# Patient Record
Sex: Female | Born: 2000 | Race: Black or African American | Hispanic: No | Marital: Single | Smoking: Never smoker
Health system: Southern US, Community
[De-identification: ages and names within clinical notes are randomized; demographics above are authoritative.]

## PROBLEM LIST (undated history)

## (undated) DIAGNOSIS — K5900 Constipation, unspecified: Secondary | ICD-10-CM

---
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
## 2019-08-19 IMAGING — US US ABDOMEN LIMITED
1 series · 6 of 6 positions shown · non-contrast
Comparison: None.

CLINICAL DATA: Right lower quadrant pain for 1 day

EXAM:
ULTRASOUND ABDOMEN LIMITED
TECHNIQUE: Gray scale imaging of the right lower quadrant was performed to
evaluate for suspected appendicitis. Standard imaging planes and
graded compression technique were utilized.

[Series 1: us abdomen limited · 0.11mm/px · 6 of 6 slices shown]
[im 1/6]
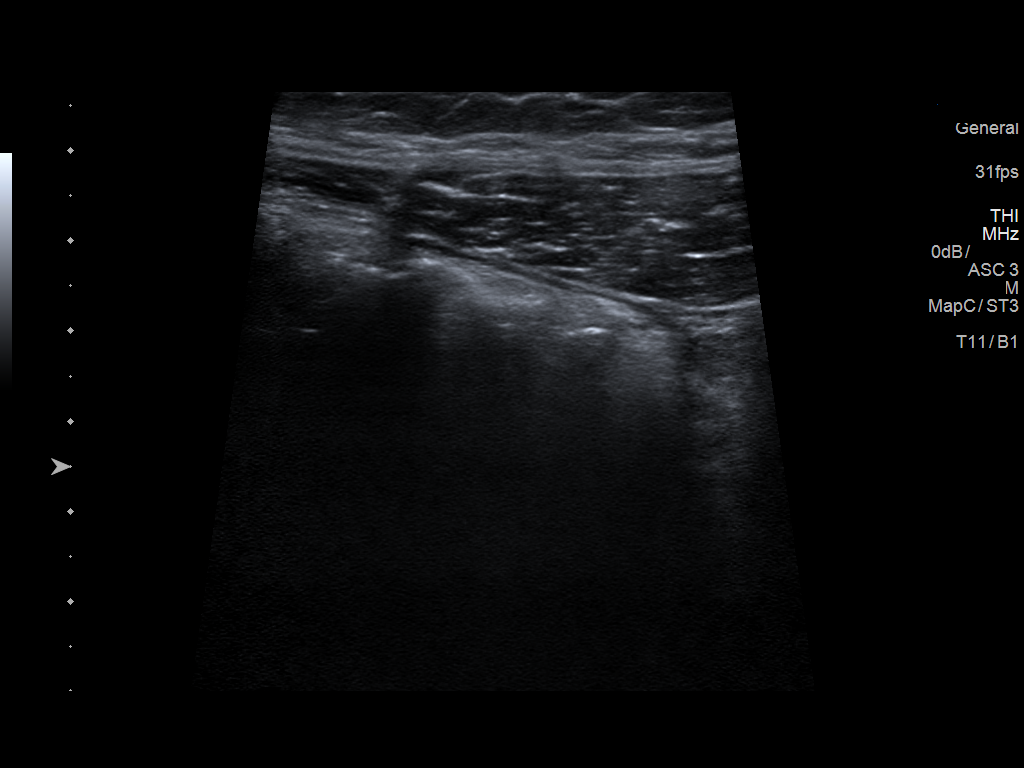
[im 2/6]
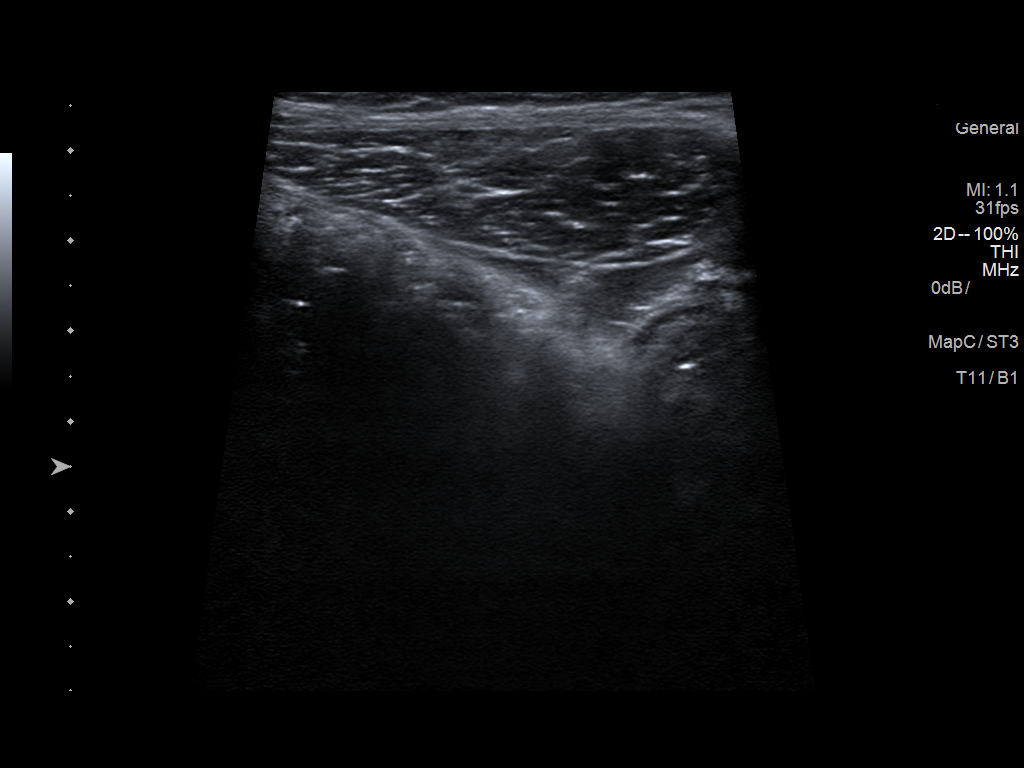
[im 3/6]
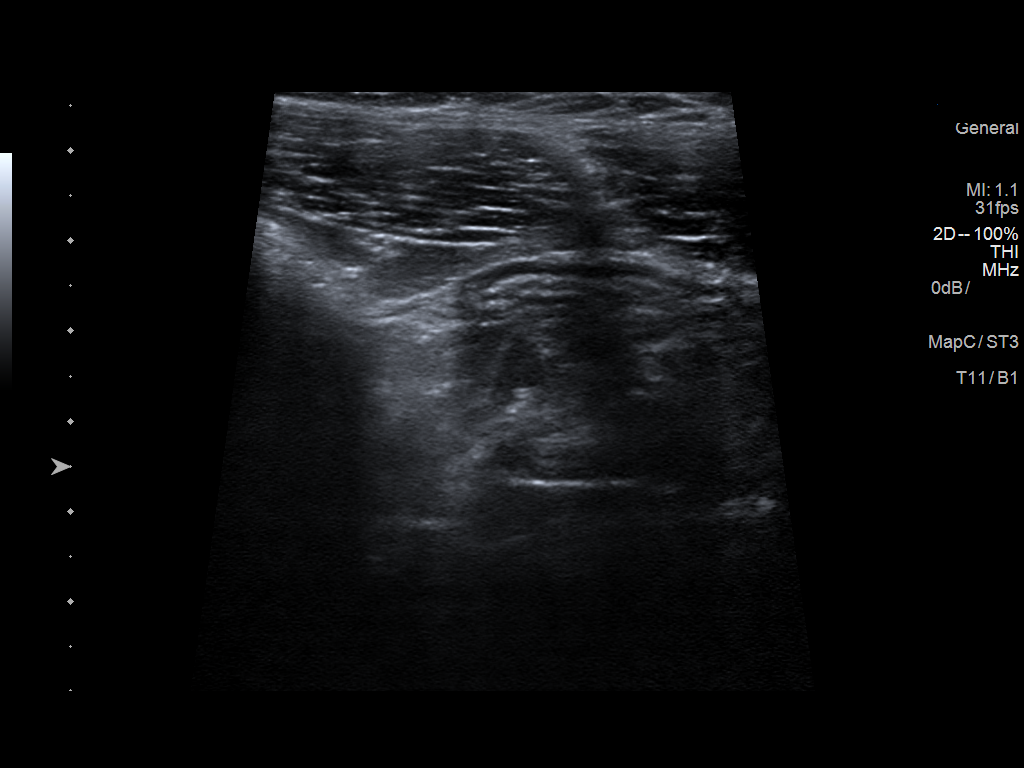
[im 4/6]
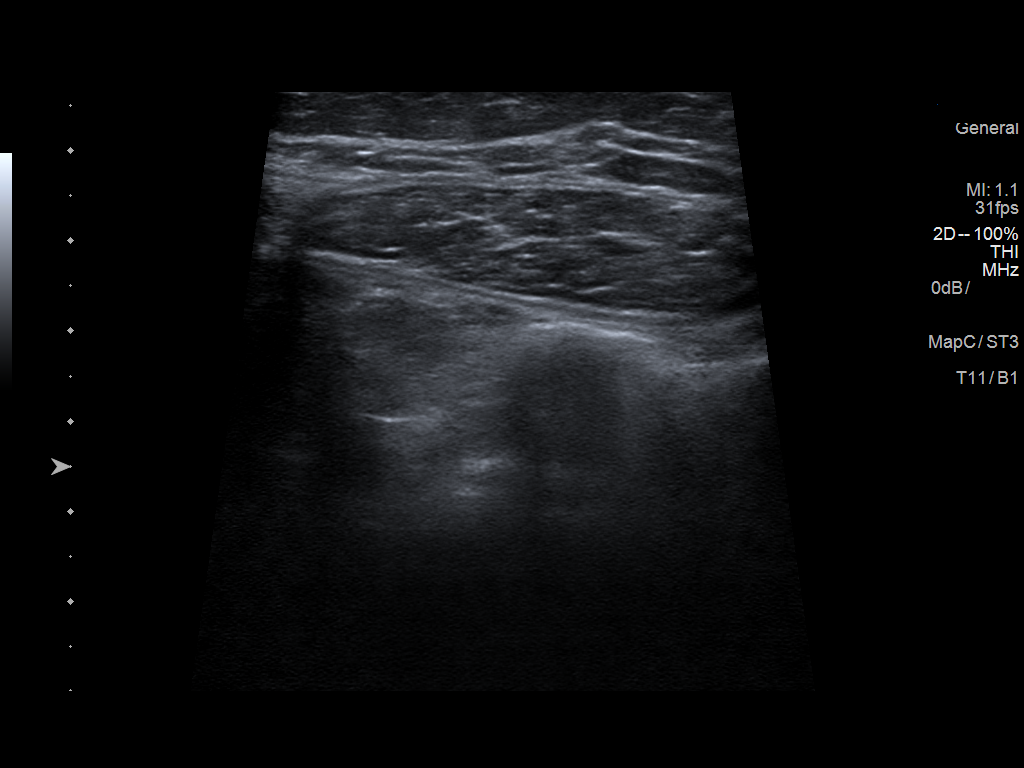
[im 5/6]
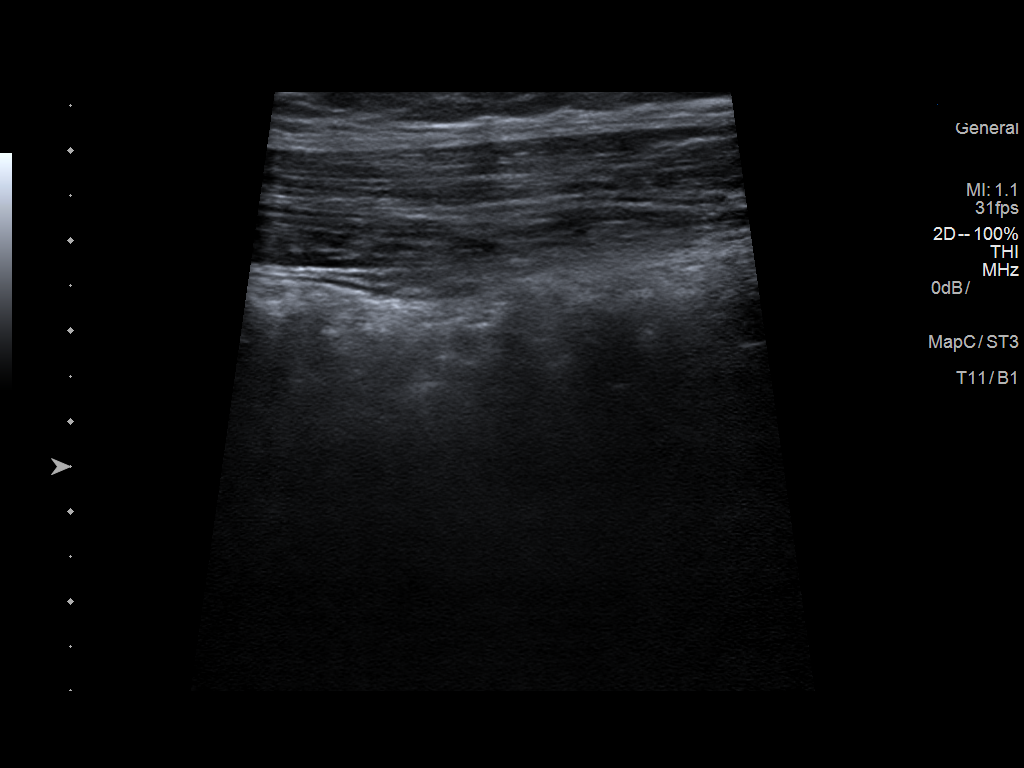
[im 6/6]
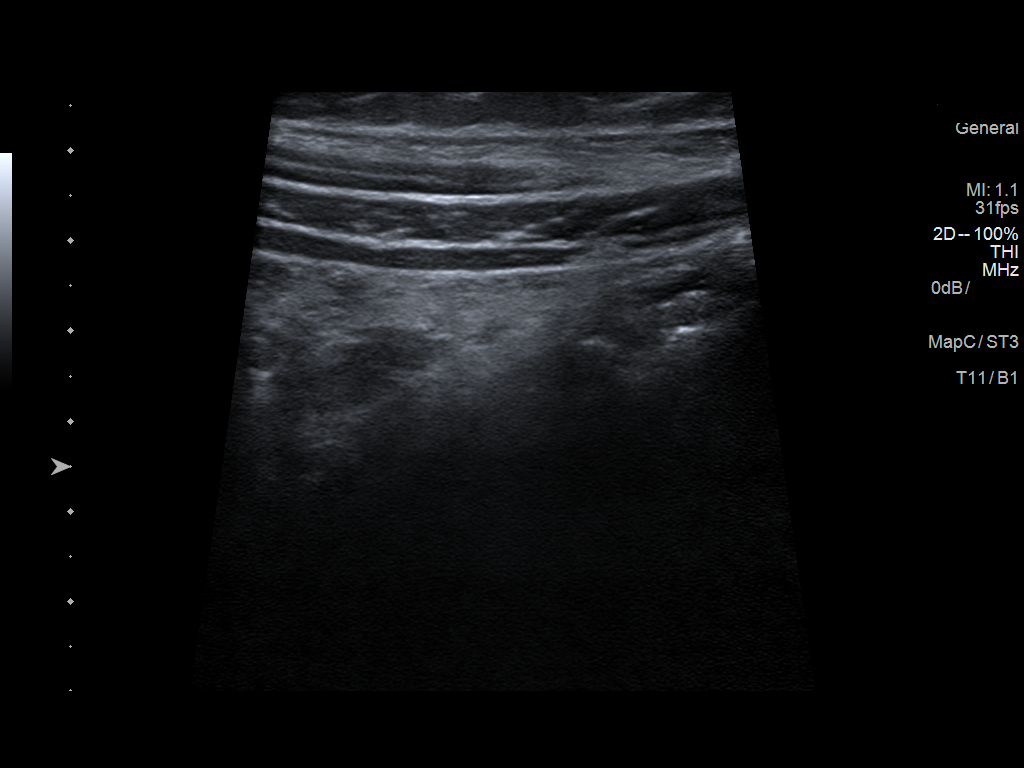

[6 of 6 positions shown; findings below may reference images not displayed]

FINDINGS: The appendix is not visualized. There is noted demonstrable dilated
tubular structure as is classically seen with acute appendiceal
inflammation. No abscess or mass seen in the right lower quadrant.

Ancillary findings: None.  No abnormal fluid.  No adenopathy.

Factors affecting image quality: None.
IMPRESSION: No appendiceal inflammation is demonstrated by ultrasound. Note that
a normal appendix is not seen on this study.

Note: Non-visualization of appendix by US does not definitely
exclude appendicitis. If there is sufficient clinical concern,
consider abdomen/ pelvis CT, ideally with oral and intravenous
contrast, for further evaluation.
